# Patient Record
Sex: Female | Born: 1990 | Race: Black or African American | Hispanic: No | Marital: Single | Smoking: Never smoker
Health system: Southern US, Community
[De-identification: ages and names within clinical notes are randomized; demographics above are authoritative.]

---

## 2014-03-31 ENCOUNTER — Emergency Department (HOSPITAL_COMMUNITY): Payer: BC Managed Care – PPO

## 2014-03-31 ENCOUNTER — Emergency Department (HOSPITAL_COMMUNITY)
Admission: EM | Admit: 2014-03-31 | Discharge: 2014-03-31 | Disposition: A | Discharge status: Home / Self Care | Payer: BC Managed Care – PPO | Attending: Emergency Medicine | Admitting: Emergency Medicine

## 2014-03-31 ENCOUNTER — Encounter (HOSPITAL_COMMUNITY): Payer: Self-pay | Admitting: Emergency Medicine

## 2014-03-31 DIAGNOSIS — R109 Unspecified abdominal pain: Secondary | ICD-10-CM | POA: Insufficient documentation

## 2014-03-31 DIAGNOSIS — N7013 Chronic salpingitis and oophoritis: Secondary | ICD-10-CM | POA: Insufficient documentation

## 2014-03-31 DIAGNOSIS — B9689 Other specified bacterial agents as the cause of diseases classified elsewhere: Secondary | ICD-10-CM

## 2014-03-31 DIAGNOSIS — N760 Acute vaginitis: Secondary | ICD-10-CM | POA: Insufficient documentation

## 2014-03-31 DIAGNOSIS — N7011 Chronic salpingitis: Secondary | ICD-10-CM

## 2014-03-31 DIAGNOSIS — N83209 Unspecified ovarian cyst, unspecified side: Secondary | ICD-10-CM | POA: Insufficient documentation

## 2014-03-31 DIAGNOSIS — N83202 Unspecified ovarian cyst, left side: Secondary | ICD-10-CM

## 2014-03-31 LAB — URINALYSIS, ROUTINE W REFLEX MICROSCOPIC
BILIRUBIN URINE: NEGATIVE
Glucose, UA: NEGATIVE mg/dL
HGB URINE DIPSTICK: NEGATIVE
Ketones, ur: NEGATIVE mg/dL
Leukocytes, UA: NEGATIVE
NITRITE: NEGATIVE
PH: 6 (ref 5.0–8.0)
Protein, ur: NEGATIVE mg/dL
Urobilinogen, UA: 0.2 mg/dL (ref 0.0–1.0)

## 2014-03-31 LAB — COMPREHENSIVE METABOLIC PANEL
ALT: 14 U/L (ref 0–35)
AST: 14 U/L (ref 0–37)
Albumin: 3.5 g/dL (ref 3.5–5.2)
Alkaline Phosphatase: 75 U/L (ref 39–117)
BUN: 12 mg/dL (ref 6–23)
CALCIUM: 9 mg/dL (ref 8.4–10.5)
CO2: 26 mEq/L (ref 19–32)
Chloride: 102 mEq/L (ref 96–112)
Creatinine, Ser: 0.73 mg/dL (ref 0.50–1.10)
GFR calc non Af Amer: >90 mL/min (ref >90)
Glucose, Bld: 86 mg/dL (ref 70–99)
Potassium: 4.2 mEq/L (ref 3.7–5.3)
SODIUM: 140 meq/L (ref 137–147)
TOTAL PROTEIN: 7.9 g/dL (ref 6.0–8.3)
Total Bilirubin: <0.2 mg/dL — ABNORMAL LOW (ref 0.3–1.2)

## 2014-03-31 LAB — CBC WITH DIFFERENTIAL/PLATELET
BASOS ABS: 0 10*3/uL (ref 0.0–0.1)
Basophils Relative: 1 % (ref 0–1)
EOS ABS: 0.2 10*3/uL (ref 0.0–0.7)
EOS PCT: 3 % (ref 0–5)
HCT: 33.8 % — ABNORMAL LOW (ref 36.0–46.0)
Hemoglobin: 9.5 g/dL — ABNORMAL LOW (ref 12.0–15.0)
Lymphocytes Relative: 37 % (ref 12–46)
Lymphs Abs: 2.2 10*3/uL (ref 0.7–4.0)
MCH: 19.8 pg — AB (ref 26.0–34.0)
MCHC: 28.1 g/dL — ABNORMAL LOW (ref 30.0–36.0)
MCV: 70.6 fL — ABNORMAL LOW (ref 78.0–100.0)
Monocytes Absolute: 0.4 10*3/uL (ref 0.1–1.0)
Monocytes Relative: 7 % (ref 3–12)
Neutro Abs: 3.2 10*3/uL (ref 1.7–7.7)
Neutrophils Relative %: 53 % (ref 43–77)
PLATELETS: 207 10*3/uL (ref 150–400)
RBC: 4.79 MIL/uL (ref 3.87–5.11)
RDW: 15 % (ref 11.5–15.5)
WBC: 6.1 10*3/uL (ref 4.0–10.5)

## 2014-03-31 LAB — LIPASE, BLOOD: Lipase: 23 U/L (ref 11–59)

## 2014-03-31 LAB — WET PREP, GENITAL
Trich, Wet Prep: NONE SEEN
YEAST WET PREP: NONE SEEN

## 2014-03-31 LAB — PREGNANCY, URINE: Preg Test, Ur: NEGATIVE

## 2014-03-31 MED ORDER — HYDROCODONE-ACETAMINOPHEN 5-325 MG PO TABS: ORAL_TABLET | ORAL | Status: AC

## 2014-03-31 MED ORDER — METRONIDAZOLE 500 MG PO TABS
500.0000 mg | ORAL_TABLET | Freq: Once | ORAL | Status: AC
  Administered 2014-03-31: 500 mg via ORAL
  Filled 2014-03-31: qty 1

## 2014-03-31 MED ORDER — DOXYCYCLINE HYCLATE 100 MG PO TABS
100.0000 mg | ORAL_TABLET | Freq: Once | ORAL | Status: AC
  Administered 2014-03-31: 100 mg via ORAL
  Filled 2014-03-31: qty 1

## 2014-03-31 MED ORDER — METRONIDAZOLE 500 MG PO TABS: 500.0000 mg | ORAL_TABLET | Freq: Two times a day (BID) | ORAL | Status: AC

## 2014-03-31 MED ORDER — CEFTRIAXONE SODIUM 250 MG IJ SOLR
250.0000 mg | Freq: Once | INTRAMUSCULAR | Status: AC
  Administered 2014-03-31: 250 mg via INTRAMUSCULAR
  Filled 2014-03-31: qty 250

## 2014-03-31 MED ORDER — LIDOCAINE HCL (PF) 2 % IJ SOLN
INTRAMUSCULAR | Status: AC
  Administered 2014-03-31: 1 mL
  Filled 2014-03-31: qty 10

## 2014-03-31 MED ORDER — NAPROXEN 250 MG PO TABS: 250.0000 mg | ORAL_TABLET | Freq: Two times a day (BID) | ORAL | Status: AC

## 2014-03-31 MED ORDER — DOXYCYCLINE HYCLATE 100 MG PO TABS: 100.0000 mg | ORAL_TABLET | Freq: Two times a day (BID) | ORAL | Status: AC

## 2014-03-31 NOTE — ED Notes (Signed)
LLQ Abdominal pain began at ~0630. Denies any other symptoms at this time.

## 2014-03-31 NOTE — ED Notes (Signed)
Pt remains in xray.

## 2014-03-31 NOTE — ED Notes (Signed)
Pelvic exam by EDP. specimen to lab

## 2014-03-31 NOTE — Discharge Instructions (Signed)
°Emergency Department Resource Guide °1) Find a Doctor and Pay Out of Pocket °Although you won't have to find out who is covered by your insurance plan, it is a good idea to ask around and get recommendations. You will then need to call the office and see if the doctor you have chosen will accept you as a new patient and what types of options they offer for patients who are self-pay. Some doctors offer discounts or will set up payment plans for their patients who do not have insurance, but you will need to ask so you aren't surprised when you get to your appointment. ° °2) Contact Your Local Health Department °Not all health departments have doctors that can see patients for sick visits, but many do, so it is worth a call to see if yours does. If you don't know where your local health department is, you can check in your phone book. The CDC also has a tool to help you locate your state's health department, and many state websites also have listings of all of their local health departments. ° °3) Find a Walk-in Clinic °If your illness is not likely to be very severe or complicated, you may want to try a walk in clinic. These are popping up all over the country in pharmacies, drugstores, and shopping centers. They're usually staffed by nurse practitioners or physician assistants that have been trained to treat common illnesses and complaints. They're usually fairly quick and inexpensive. However, if you have serious medical issues or chronic medical problems, these are probably not your best option. ° °No Primary Care Doctor: °- Call [facility] at  832-8000 - they can help you locate a primary care doctor that  accepts your insurance, provides certain services, etc. °- Physician Referral Service- 1-800-533-3463 ° °Chronic Pain Problems: °Organization         Address  Phone   Notes  °[facility]  (336) 297-2271 Patients need to be referred by their primary care doctor.  ° °Medication  Assistance: °Organization         Address  Phone   Notes  °[place] [facility] [address] °[place], [place] [address] (336) 641-8030 --Must be a resident of [place] °-- Must have NO insurance coverage whatsoever (no Medicaid/ Medicare, etc.) °-- The pt. MUST have a primary care doctor that directs their care regularly and follows them in the community °  °[facility]  (866) 331-1348   °[employer]  (888) 892-1162   ° °Agencies that provide inexpensive medical care: °Organization         Address  Phone   Notes  °[facility]  (336) 832-8035   °[facility]    (336) 832-7272   °[facility] [address] °[place], [place] [address] (336) 832-4777   °[facility] [address], °[place] (336) 271-4999   °[facility]    (336) 373-0678   °[facility]    (336) 272-1050   °[facility] ° [address], [place] Phone:  (336) 832-4444, Fax:  (336) 832-4440 Hours of Operation:  9 am - 6 pm, M-F.  Also accepts Medicaid/Medicare and self-pay.  °[facility] ° [address], [place] Phone: (336) [phone], Fax: (336) [phone]. Hours of Operation:  8:30 am - 5:30 pm, M-F.  Also accepts Medicaid and self-pay.  °[facility] [place] [address], [place] Phone: (336) 878-6027   °[facility] [address], [place], [place] (336)723-1848, Ext. 123 Mondays & Thursdays: 7-9 AM.  First 15 patients are seen on a first come, first serve basis. °  ° °Medicaid-accepting [place] Providers: ° °Organization         Address  Phone   Notes  °[facility] [address], [place] (336) 641-2100 Also accepts self-pay patients.  °[facility] [address], [place] ° (336) 856-9996   °[facility] [address], [place]  (336) 288-8857   °[facility] [address] [place] [address], [place] (336) 299-7000   °Veita Bland [address], [place]  ° (336) 373-1557 Only accepts [place] Access Medicaid patients after they have their name applied to their card.  ° °Self-Pay (no insurance) in [place]: ° °Organization         Address  Phone   Notes  °Sickle Cell Patients, [facility] [address], [place] (336) 832-1970   °[facility] [address], [place] (336) 832-4400   °[facility] ° [address], [place] (336) 992-4800   °[facility]/Dr. Osei-Bonsu ° [address] [place] [address], [place] or [address], [place] (336) [phone] Phone number for both [place] and [place] locations is the same.  °[facility] [address], [place] (336) 299-0000   °[facility] [address] [place] [address], [place] or [address] (336) 852-7530 °(336) 878-2260   °[facility] [address], [place] (336) 350-1642, phone; (336) 294-5005, fax Sees patients 1st and 3rd Saturday of every month.  Must not qualify for public or private insurance (i.e. Medicaid, Medicare, [place] Health Choice, Veterans' Benefits) • Household income should be no more than 200% of the poverty level •The clinic cannot treat you if you are pregnant or think you are pregnant • Sexually transmitted diseases are not treated at the clinic.  ° ° °Dental Care: °Organization         Address  Phone  Notes  °[place] [facility] [address], [place] (336) 641-6152 Accepts children up to age 21 who are enrolled in Medicaid or [place] Health Choice; pregnant women with a Medicaid card; and children who have applied for Medicaid or [place] Health Choice, but were declined, whose parents can pay a reduced fee at time of service.  °[place]  [facility] [place]  [address], [place] (336) 641-7733 Accepts children up to age 21 who are enrolled in Medicaid or [place] Health Choice; pregnant women with a Medicaid card; and children who have applied for Medicaid or [place] Health Choice, but were declined, whose parents can pay a reduced fee at time of service.  °[facility] ° [address], [place] (336) 641-4533 Patients are seen by appointment only. Walk-ins are not accepted. [facility] will see patients 18 years of age and older. °Monday - Tuesday (8am-5pm) °Most Wednesdays (8:30-5pm) °$30 per visit, cash only  °[facility] ° [address], [place] (336) 641-4533 Patients are seen by appointment only. Walk-ins are not accepted. [facility] will see patients 18 years of age and older. °One   Wednesday Evening (Monthly: Volunteer Based).  $30 per visit, cash only  °[facility]  (919) 537-3737 for adults; Children under age 4, call Graduate Pediatric Dentistry at ([phone]) [phone]. Children aged 4-14, please call ([phone]) [phone] to request a pediatric application. ° Dental services are provided in all areas of dental care including fillings, crowns and bridges, complete and partial dentures, implants, gum treatment, root canals, and extractions. Preventive care is also provided. Treatment is provided to both adults and children. °Patients are selected via a lottery and there is often a waiting list. °  °[facility] [address], °[place] ° (336) 763-8833 [email or web address] °  °[facility] [address], [place], [place] (336)723-1848, Ext. 123 Second and Fourth Thursday of each month, opens at 6:30 AM; Clinic ends at 9 AM.  Patients are seen on a first-come first-served basis, and a limited number are seen during each clinic.  ° °[facility] ° [address], [place], [place] (336) 723-7904    Eligibility Requirements °You must have lived in [place], [place], or [place] counties for at least the last three months. °  You cannot be eligible for state or federal sponsored healthcare insurance, including Veterans Administration, Medicaid, or Medicare. °  You generally cannot be eligible for healthcare insurance through your employer.  °  How to apply: °Eligibility screenings are held every Tuesday and Wednesday afternoon from 1:00 pm until 4:00 pm. You do not need an appointment for the interview!  °[facility] [address], [place], [place] 336-631-2330   °[facility]  336-342-8273   °[facility]  336-703-3100   °[facility]  336-570-6415   ° °Behavioral Health Resources in the Community: °Intensive Outpatient Programs °Organization         Address  Phone  Notes  °[facility] [address], [place], [place] 336-878-6098   °[facility] [address], [place], [place] 336-832-9800   °[facility] [address], [place], [place] ° 336-882-2125   °[facility] [address],  °[place], [place] 1-800-853-5163 or 336-641-4981   °Substance Abuse Resources °Organization         Address  Phone  Notes  °[facility]  336-882-2125   °[facility]  336-784-9470   °[facility]  336-285-9073   °[facility]  336-845-3988   °Residential & Outpatient Substance Abuse Program  1-800-659-3381   °Psychological Services °Organization         Address  Phone  Notes  °[facility]  336- 832-9600   °[facility]  336- 378-7881   °[facility] [address], [place] 1-800-853-5163 or 336-641-4981   ° °Mobile Crisis Teams °Organization         Address  Phone  Notes  °[facility], Mobile Crisis Care Unit  1-877-626-1772   °Assertive °[facility] ° [address]  [place], [place] 336-834-9664   °Sharon DeEsch [address] °[place] 336-554-5454   ° °Self-Help/Support Groups °Organization         Address  Phone             Notes  °[facility]  - variety of support groups  336- 373-1402 Call for more information  °Narcotics Anonymous (NA), [facility] [address], °[place]  2 meetings at this location  ° °  Residential Treatment Programs Organization         Address  Phone  Notes  [facility] [address],    [place]  [phone]   [facility]  [address], [place] [phone], [place], [place] [phone]   [facility] [address], [place] [phone] Admissions: 8am-3pm M-F  [facility] [address]    [place], [place] [phone]   [facility] [address], [place], [place] [phone]   [facility] [address]  [place], [place] [phone]) [phone]   [facility] Intensive Outpatient [address], [place], [place] [phone]) [phone]   [facility] ([facility] [address]  [place], [place] [phone] or [phone]   [facility] [address] [place], [place] [phone]) [phone] Accepts Medicaid  [facility] [address]  [place] [phone] Substance Abuse/Addiction Treatment   [facility]         Address  Phone  Notes  [facility]  [phone](888) ([phone]   Angie FavaJulie Brannon, PhD [address], [address] [place], [place]   [phone] or [phone](336) [phone]   [facility]   [address] [place], [place] [phone](336) [phone]   [facility] [address], [place], [place] [phone](336) [phone] Insurance/Medicaid/sponsorship through [facility]  [facility] [address]                                    [place], [place] [phone](336) [phone] Therapy/tele-psych/case    [facility] [address]   [place], [place] [phone]) [phone]    Dr. Lolly MustacheArfeen  [phone]) [phone]   [facility]  [employer] [facility] 1) [address], [place] 2) [address], [place] 3)  [address], [place] [phone](336) [phone](336) [phone]  ([phone](336) [phone]   [facility] Child [facility] (819)572-2373(336) [phone] or [phone](336) [phone] (After Hours)       Take the prescriptions as directed.  Your gonorrhea and chlamydia culture is pending results, and you will receive a phone call in the next several days if it is positive.  However, you were treated empirically today with antibiotics for both gonorrhea and chlamydia.  Call your regular OB/GYN doctor tomorrow to schedule a follow up appointment within the next 3 days.  Return to the Emergency Department immediately if worsening.

## 2014-03-31 NOTE — ED Provider Notes (Signed)
CSN: [id number]     Arrival date & time 03/31/14  1234 History   First MD Initiated Contact with Patient 03/31/14 1518     Chief Complaint  Patient presents with  . Abdominal Pain  . Pelvic Pain     HPI Pt was seen at 1530.  Per pt, c/o gradual onset and persistence of constant left sided abd "pain" that began this morning when she woke up at 0600. Describes the abd pain as "hard cramping," and "it feels like I have to have a BM." Denies N/V/D, no fevers, no back pain, no rash, no CP/SOB, no black or blood in stools, no dysuria/hematuria, no vaginal bleeding/discharge.      History reviewed. No pertinent past medical history.  History reviewed. No pertinent past surgical history.   History  Substance Use Topics  . Smoking status: Never Smoker   . Smokeless tobacco: Not on file  . Alcohol Use: No    Review of Systems ROS: Statement: All systems negative except as marked or noted in the HPI; Constitutional: Negative for fever and chills. ; ; Eyes: Negative for eye pain, redness and discharge. ; ; ENMT: Negative for ear pain, hoarseness, nasal congestion, sinus pressure and sore throat. ; ; Cardiovascular: Negative for chest pain, palpitations, diaphoresis, dyspnea and peripheral edema. ; ; Respiratory: Negative for cough, wheezing and stridor. ; ; Gastrointestinal: +abd pain. Negative for nausea, vomiting, diarrhea, blood in stool, hematemesis, jaundice and rectal bleeding. . ; ; Genitourinary: Negative for dysuria, flank pain and hematuria. ; ; GYN:  No vaginal bleeding, no vaginal discharge, no vulvar pain.;; Musculoskeletal: Negative for back pain and neck pain. Negative for swelling and trauma.; ; Skin: Negative for pruritus, rash, abrasions, blisters, bruising and skin lesion.; ; Neuro: Negative for headache, lightheadedness and neck stiffness. Negative for weakness, altered level of consciousness , altered mental status, extremity weakness, paresthesias, involuntary movement, seizure  and syncope.        Allergies  Review of patient's allergies indicates no known allergies.  Home Medications   Prior to Admission medications   Medication Sig Start Date End Date Taking? Authorizing Provider  acetaminophen (TYLENOL) 500 MG tablet Take 1,000 mg by mouth daily as needed for moderate pain.   Yes Historical Provider, MD   BP 135/57  Pulse 75  Temp(Src) 98.1 F (36.7 C) (Oral)  Resp 16  Ht 5\' 3"  (1.6 m)  Wt 190 lb (86.183 kg)  BMI 33.67 kg/m2  SpO2 100%  LMP 03/17/2014 Physical Exam 1535: Physical examination:  Nursing notes reviewed; Vital signs and O2 SAT reviewed;  Constitutional: Well developed, Well nourished, Well hydrated, In no acute distress; Head:  Normocephalic, atraumatic; Eyes: EOMI, PERRL, No scleral icterus; ENMT: Mouth and pharynx normal, Mucous membranes moist; Neck: Supple, Full range of motion, No lymphadenopathy; Cardiovascular: Regular rate and rhythm, No murmur, rub, or gallop; Respiratory: Breath sounds clear & equal bilaterally, No rales, rhonchi, wheezes.  Speaking full sentences with ease, Normal respiratory effort/excursion; Chest: Nontender, Movement normal; Abdomen: Soft, +LLQ>LUQ tenderness to palp. No rebound or guarding. Nondistended, Normal bowel sounds; Genitourinary: No CVA tenderness. Pelvic exam performed with permission of pt and female ED tech assist during exam.  External genitalia w/o lesions. Vaginal vault with copious thin white discharge.  Cervix w/o lesions, not friable, GC/chlam and wet prep obtained and sent to lab.  Bimanual exam w/o CMT, uterine or right adnexal tenderness, +left pelvic tenderness..;; Extremities: Pulses normal, No tenderness, No edema, No calf edema or asymmetry.; Neuro:  AA&Ox3, Major CN grossly intact.  Speech clear. No gross focal motor or sensory deficits in extremities.; Skin: Color normal, Warm, Dry.   ED Course  Procedures     MDM  MDM Reviewed: previous chart, nursing note and vitals Reviewed  previous: labs Interpretation: labs, x-ray and ultrasound    Results for orders placed during the hospital encounter of 03/31/14  WET PREP, GENITAL      Result Value Ref Range   Yeast Wet Prep HPF POC NONE SEEN  NONE SEEN   Trich, Wet Prep NONE SEEN  NONE SEEN   Clue Cells Wet Prep HPF POC FEW (*) NONE SEEN   WBC, Wet Prep HPF POC FEW (*) NONE SEEN  CBC WITH DIFFERENTIAL      Result Value Ref Range   WBC 6.1  4.0 - 10.5 K/uL   RBC 4.79  3.87 - 5.11 MIL/uL   Hemoglobin 9.5 (*) 12.0 - 15.0 g/dL   HCT 47.833.8 (*) 29.536.0 - 62.146.0 %   MCV 70.6 (*) 78.0 - 100.0 fL   MCH 19.8 (*) 26.0 - 34.0 pg   MCHC 28.1 (*) 30.0 - 36.0 g/dL   RDW 30.815.0  65.711.5 - 84.615.5 %   Platelets 207  150 - 400 K/uL   Neutrophils Relative % 53  43 - 77 %   Neutro Abs 3.2  1.7 - 7.7 K/uL   Lymphocytes Relative 37  12 - 46 %   Lymphs Abs 2.2  0.7 - 4.0 K/uL   Monocytes Relative 7  3 - 12 %   Monocytes Absolute 0.4  0.1 - 1.0 K/uL   Eosinophils Relative 3  0 - 5 %   Eosinophils Absolute 0.2  0.0 - 0.7 K/uL   Basophils Relative 1  0 - 1 %   Basophils Absolute 0.0  0.0 - 0.1 K/uL  COMPREHENSIVE METABOLIC PANEL      Result Value Ref Range   Sodium 140  137 - 147 mEq/L   Potassium 4.2  3.7 - 5.3 mEq/L   Chloride 102  96 - 112 mEq/L   CO2 26  19 - 32 mEq/L   Glucose, Bld 86  70 - 99 mg/dL   BUN 12  6 - 23 mg/dL   Creatinine, Ser 9.620.73  0.50 - 1.10 mg/dL   Calcium 9.0  8.4 - 95.210.5 mg/dL   Total Protein 7.9  6.0 - 8.3 g/dL   Albumin 3.5  3.5 - 5.2 g/dL   AST 14  0 - 37 U/L   ALT 14  0 - 35 U/L   Alkaline Phosphatase 75  39 - 117 U/L   Total Bilirubin <0.2 (*) 0.3 - 1.2 mg/dL   GFR calc non Af Amer >90  >90 mL/min   GFR calc Af Amer >90  >90 mL/min  URINALYSIS, ROUTINE W REFLEX MICROSCOPIC      Result Value Ref Range   Color, Urine YELLOW  YELLOW   APPearance CLEAR  CLEAR   Specific Gravity, Urine >1.030 (*) 1.005 - 1.030   pH 6.0  5.0 - 8.0   Glucose, UA NEGATIVE  NEGATIVE mg/dL   Hgb urine dipstick NEGATIVE   NEGATIVE   Bilirubin Urine NEGATIVE  NEGATIVE   Ketones, ur NEGATIVE  NEGATIVE mg/dL   Protein, ur NEGATIVE  NEGATIVE mg/dL   Urobilinogen, UA 0.2  0.0 - 1.0 mg/dL   Nitrite NEGATIVE  NEGATIVE   Leukocytes, UA NEGATIVE  NEGATIVE  PREGNANCY, URINE      Result  Value Ref Range   Preg Test, Ur NEGATIVE  NEGATIVE  LIPASE, BLOOD      Result Value Ref Range   Lipase 23  11 - 59 U/L   US Transvaginal Non-ob US Art/ven Flow Abd Pelv Doppler 03/31/2014   CLINICAL DATA:  LEFT pelvic pain question ovarian torsion  EXAM: TRANSABDOMINAL AND TRANSVAGINAL ULTRASOUND OF PELVIS  DOPPLER ULTRASOUND OF OVARIES  TECHNIQUE: Both transabdominal and transvaginal ultrasound examinations of the pelvis were performed. Transabdominal technique was performed for global imaging of the pelvis including uterus, ovaries, adnexal regions, and pelvic cul-de-sac.  It was necessary to proceed with endovaginal exam following the transabdominal exam to visualize the ovaries and adnexae. Color and duplex Doppler ultrasound was utilized to evaluate blood flow to the ovaries. Transabdominal imaging limited by incomplete bladder distention and poor acoustic window.  COMPARISON:  None  FINDINGS: Uterus  Measurements: 8.4 x 4.7 x 5.7 cm. Normal morphology without mass.  Endometrium  Thickness: 12 mm thick, normal. No endometrial fluid or focal abnormality.  Right ovary  Measurements: 4.6 x 2.0 x 3.4 cm. Normal morphology with a small hypoechoic nodule question small hemorrhagic follicle cyst 1.7 x 0.9 x 1.3 cm. Blood flow present within RIGHT ovary on color Doppler imaging.  Left ovary  Measurements: 5.6 x 5.5 x 4.5 cm. Minimally hypoechoic nodule within LEFT ovary 4.4 x 4.0 x 3.5 cm. This could represent a hemorrhagic cyst or endometrioma. No additional mass lesions. Blood flow present within the LEFT ovary but not within the 4.4 cm mass on color Doppler imaging.  Pulsed Doppler evaluation of both ovaries demonstrates normal low-resistance  arterial and venous waveforms.  Other findings  Probable mild RIGHT hydrosalpinx.  No free pelvic fluid.  IMPRESSION: Probable mild RIGHT hydrosalpinx.  Otherwise unremarkable RIGHT ovary and uterus.  Slightly hypoechoic mass within LEFT ovary 4.4 x 4.0 x 3.5 cm, question hemorrhagic cyst versus endometrioma, tumor considered less likely but not excluded.  No evidence of ovarian torsion.  Followup ultrasound recommended in 6-12 weeks at 1 week following onset of menses to reassess this lesion.   Electronically Signed   By: Ulyses Southward M.D.   On: 03/31/2014 16:53   Dg Abd Acute W/chest 03/31/2014   CLINICAL DATA:  Abdominal pain  EXAM: ACUTE ABDOMEN SERIES (ABDOMEN 2 VIEW & CHEST 1 VIEW)  COMPARISON:  None.  FINDINGS: PA chest: Lungs are clear. The heart size and pulmonary vascularity are normal. No adenopathy.  Supine and upright abdomen: The bowel gas pattern is unremarkable. No obstruction or free air. There is moderate stool in the colon. There are no abnormal calcifications.  IMPRESSION: Bowel gas pattern unremarkable. Moderate stool in colon. Lungs clear.   Electronically Signed   By: Bretta Bang M.D.   On: 03/31/2014 17:05    1810:  Will tx for BV and likely STD with abx. Tx ovarian cyst symptomatically at this time. Strongly encouraged to f/u with OB/GYN; verb understanding. Dx and testing d/w pt.  Questions answered.  Verb understanding, agreeable to d/c home with outpt f/u.   Laray Anger, DO 04/03/14 1718

## 2014-04-01 LAB — GC/CHLAMYDIA PROBE AMP
CT Probe RNA: POSITIVE — AB
GC Probe RNA: NEGATIVE

## 2014-04-03 ENCOUNTER — Telehealth (HOSPITAL_BASED_OUTPATIENT_CLINIC_OR_DEPARTMENT_OTHER): Payer: Self-pay | Admitting: Emergency Medicine

## 2014-04-03 NOTE — Telephone Encounter (Signed)
+  Chlamydia. Patient treated with Rocephin and Doxycycline. [employer] faxed.

## 2014-04-06 ENCOUNTER — Telehealth (HOSPITAL_BASED_OUTPATIENT_CLINIC_OR_DEPARTMENT_OTHER): Payer: Self-pay

## 2014-04-06 NOTE — Telephone Encounter (Signed)
Pt ID verified # 2. Pt informed of dx, tx rcvd approp., notify partner(s) for testing and tx and abstain from sex x 2 wks form tx date.

## 2014-04-15 ENCOUNTER — Encounter: Payer: Self-pay | Admitting: Obstetrics & Gynecology

## 2015-11-24 IMAGING — US US TRANSVAGINAL NON-OB
1 series · 13 of 25 positions shown · non-contrast
Comparison: None

CLINICAL DATA: LEFT pelvic pain question ovarian torsion

EXAM:
TRANSABDOMINAL AND TRANSVAGINAL ULTRASOUND OF PELVIS
DOPPLER ULTRASOUND OF OVARIES
TECHNIQUE: Both transabdominal and transvaginal ultrasound examinations of the
pelvis were performed. Transabdominal technique was performed for
global imaging of the pelvis including uterus, ovaries, adnexal
regions, and pelvic cul-de-sac.
It was necessary to proceed with endovaginal exam following the
transabdominal exam to visualize the ovaries and adnexae. Color and
duplex Doppler ultrasound was utilized to evaluate blood flow to the
ovaries. Transabdominal imaging limited by incomplete bladder
distention and poor acoustic window.

[Series 1: us transvaginal non-ob · 0.20mm/px · 13 of 137 slices shown]
[im 1/137]
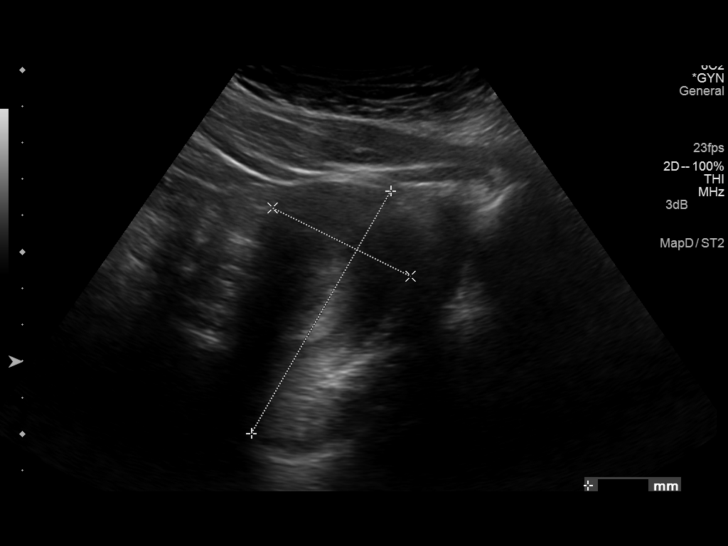
[im 12/137]
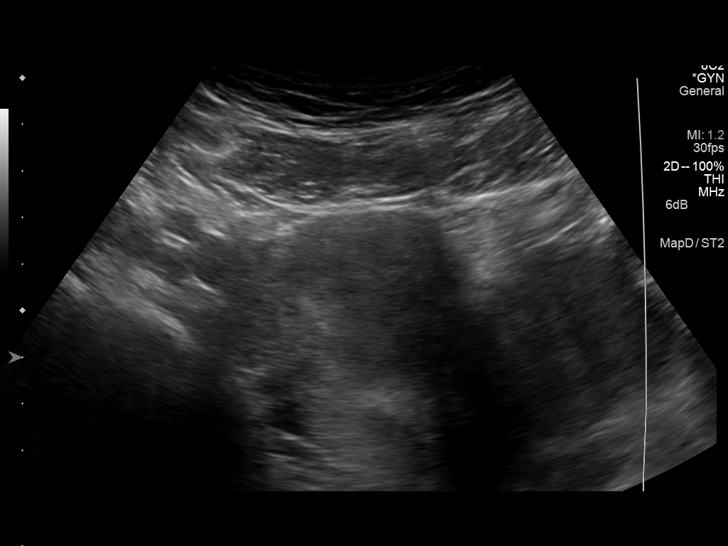
[im 23/137]
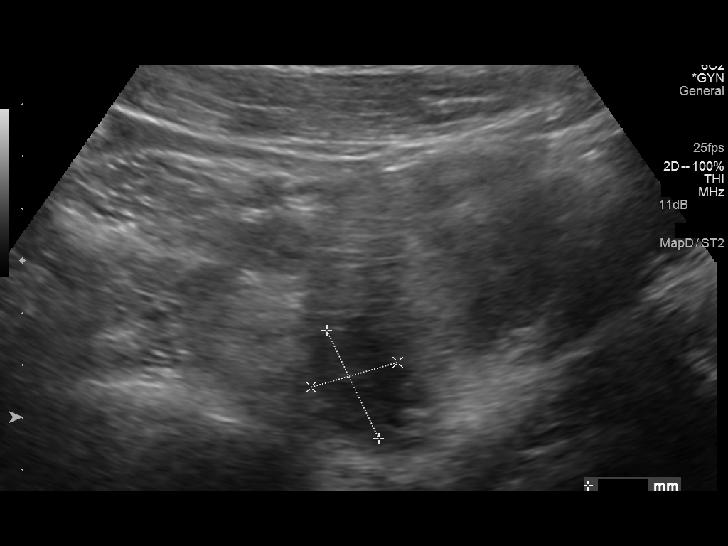
[im 35/137]
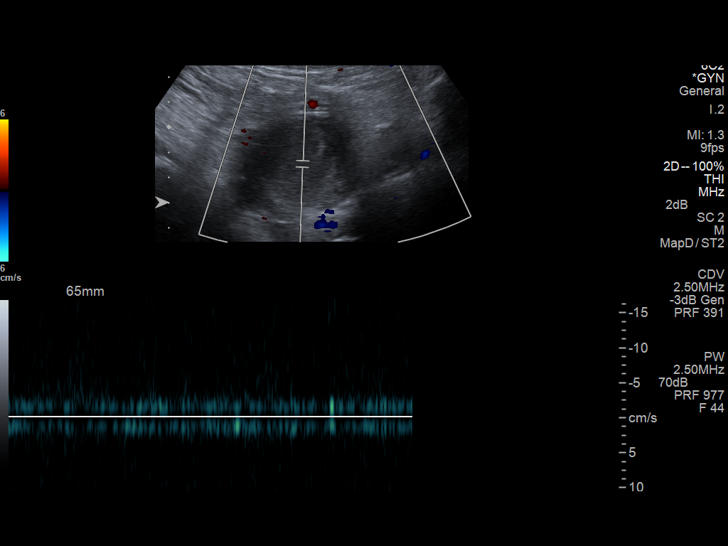
[im 46/137]
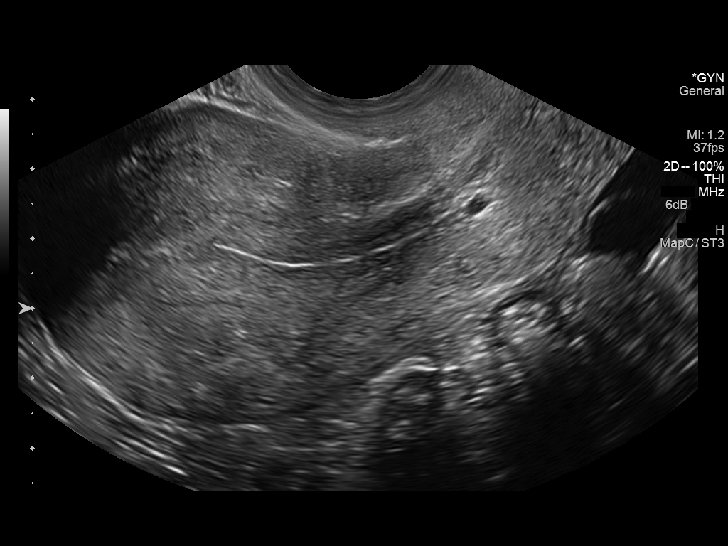
[im 57/137]
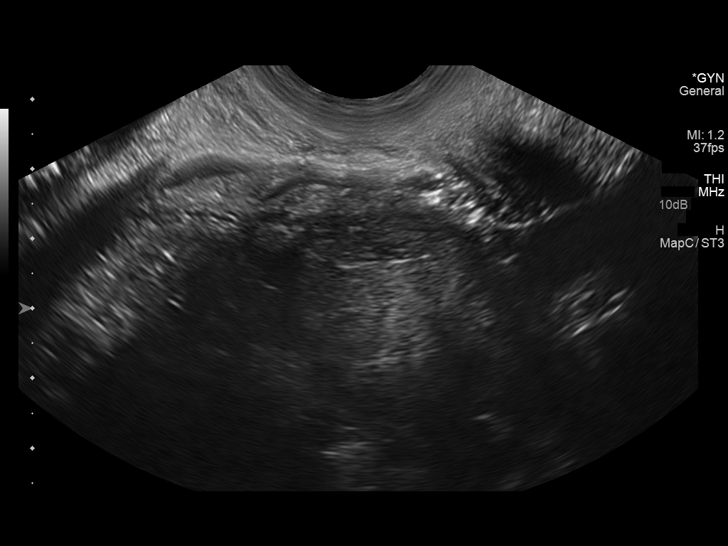
[im 69/137]
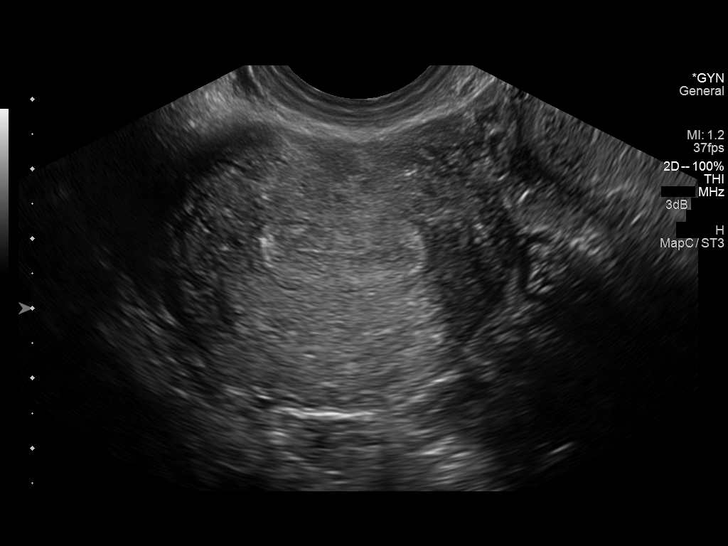
[im 80/137]
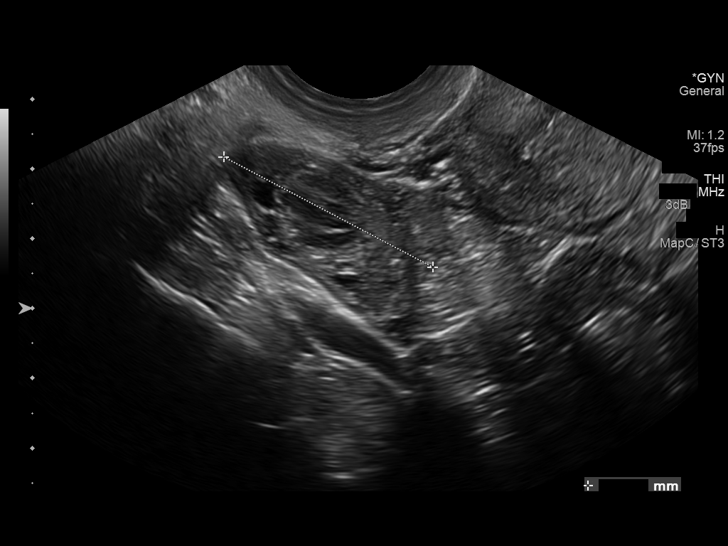
[im 91/137]
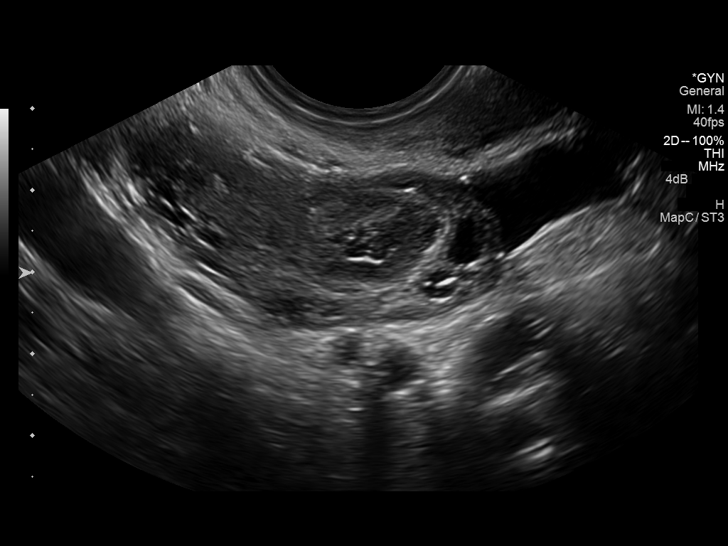
[im 103/137]
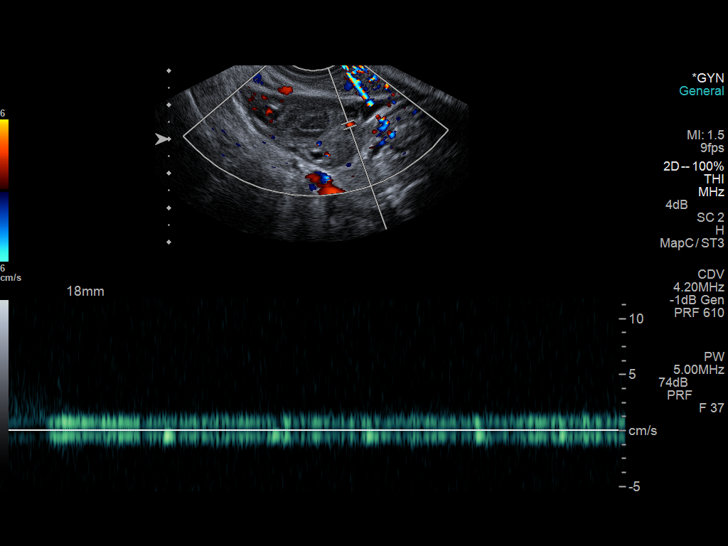
[im 114/137]
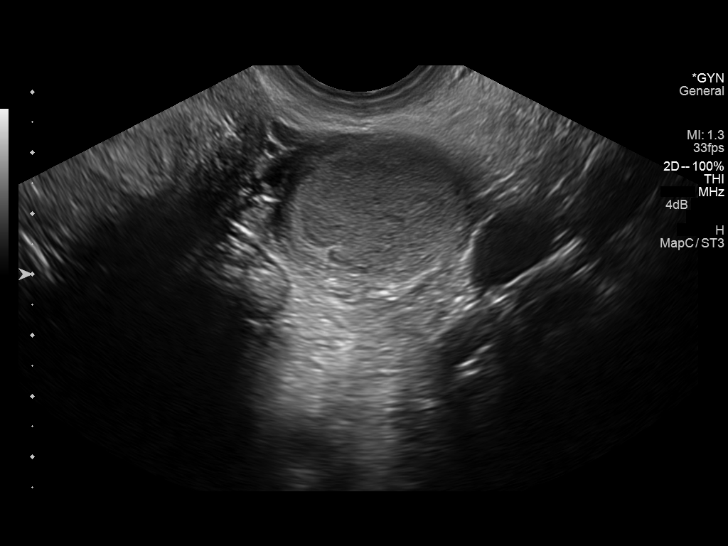
[im 125/137]
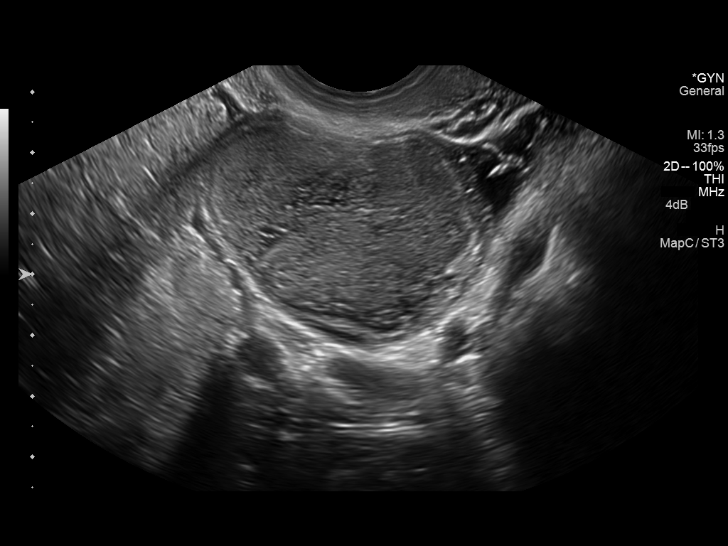
[im 137/137]
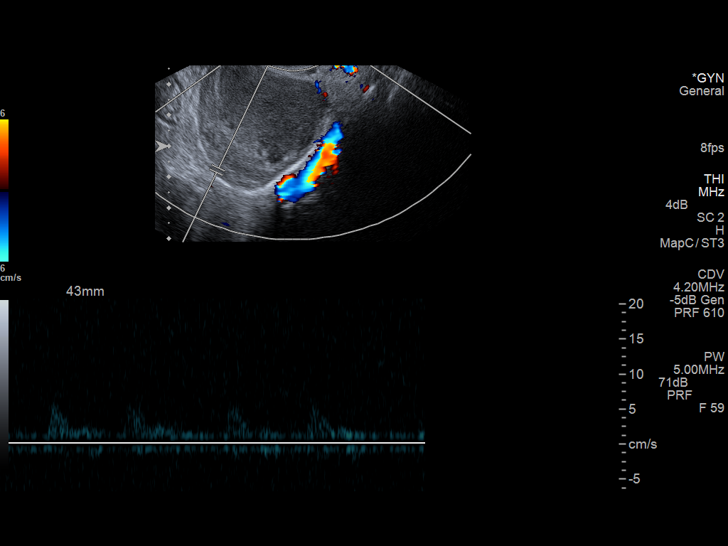

[13 of 25 positions shown; findings below may reference images not displayed]

FINDINGS: Uterus

Measurements: 8.4 x 4.7 x 5.7 cm. Normal morphology without mass.

Endometrium

Thickness: 12 mm thick, normal.. No endometrial fluid or focal
abnormality.

Right ovary

Measurements: 4.6 x 2.0 x 3.4 cm. Normal morphology with a small
hypoechoic nodule question small hemorrhagic follicle cyst 1.7 x
x 1.3 cm. Blood flow present within RIGHT ovary on color Doppler
imaging.

Left ovary

Measurements: 5.6 x 5.5 x 4.5 cm. Minimally hypoechoic nodule within
LEFT ovary 4.4 x 4.0 x 3.5 cm. This could represent a hemorrhagic
cyst or endometrioma. No additional mass lesions. Blood flow present
within the LEFT ovary but not within the 4.4 cm mass on color
Doppler imaging.

Pulsed Doppler evaluation of both ovaries demonstrates normal
low-resistance arterial and venous waveforms.

Other findings

Probable mild RIGHT hydrosalpinx.  No free pelvic fluid.
IMPRESSION: Probable mild RIGHT hydrosalpinx.

Otherwise unremarkable RIGHT ovary and uterus.

Slightly hypoechoic mass within LEFT ovary 4.4 x 4.0 x 3.5 cm,
question hemorrhagic cyst versus endometrioma, tumor considered less
likely but not excluded.

No evidence of ovarian torsion.

Followup ultrasound recommended in 6-12 weeks at 1 week following
onset of menses to reassess this lesion.

## 2015-11-24 IMAGING — CR DG ABDOMEN ACUTE W/ 1V CHEST
3 series · 3 of 3 positions shown · non-contrast
Comparison: None.

CLINICAL DATA: Abdominal pain

EXAM:
ACUTE ABDOMEN SERIES (ABDOMEN 2 VIEW & CHEST 1 VIEW)

[view not recorded (1 of 3)]
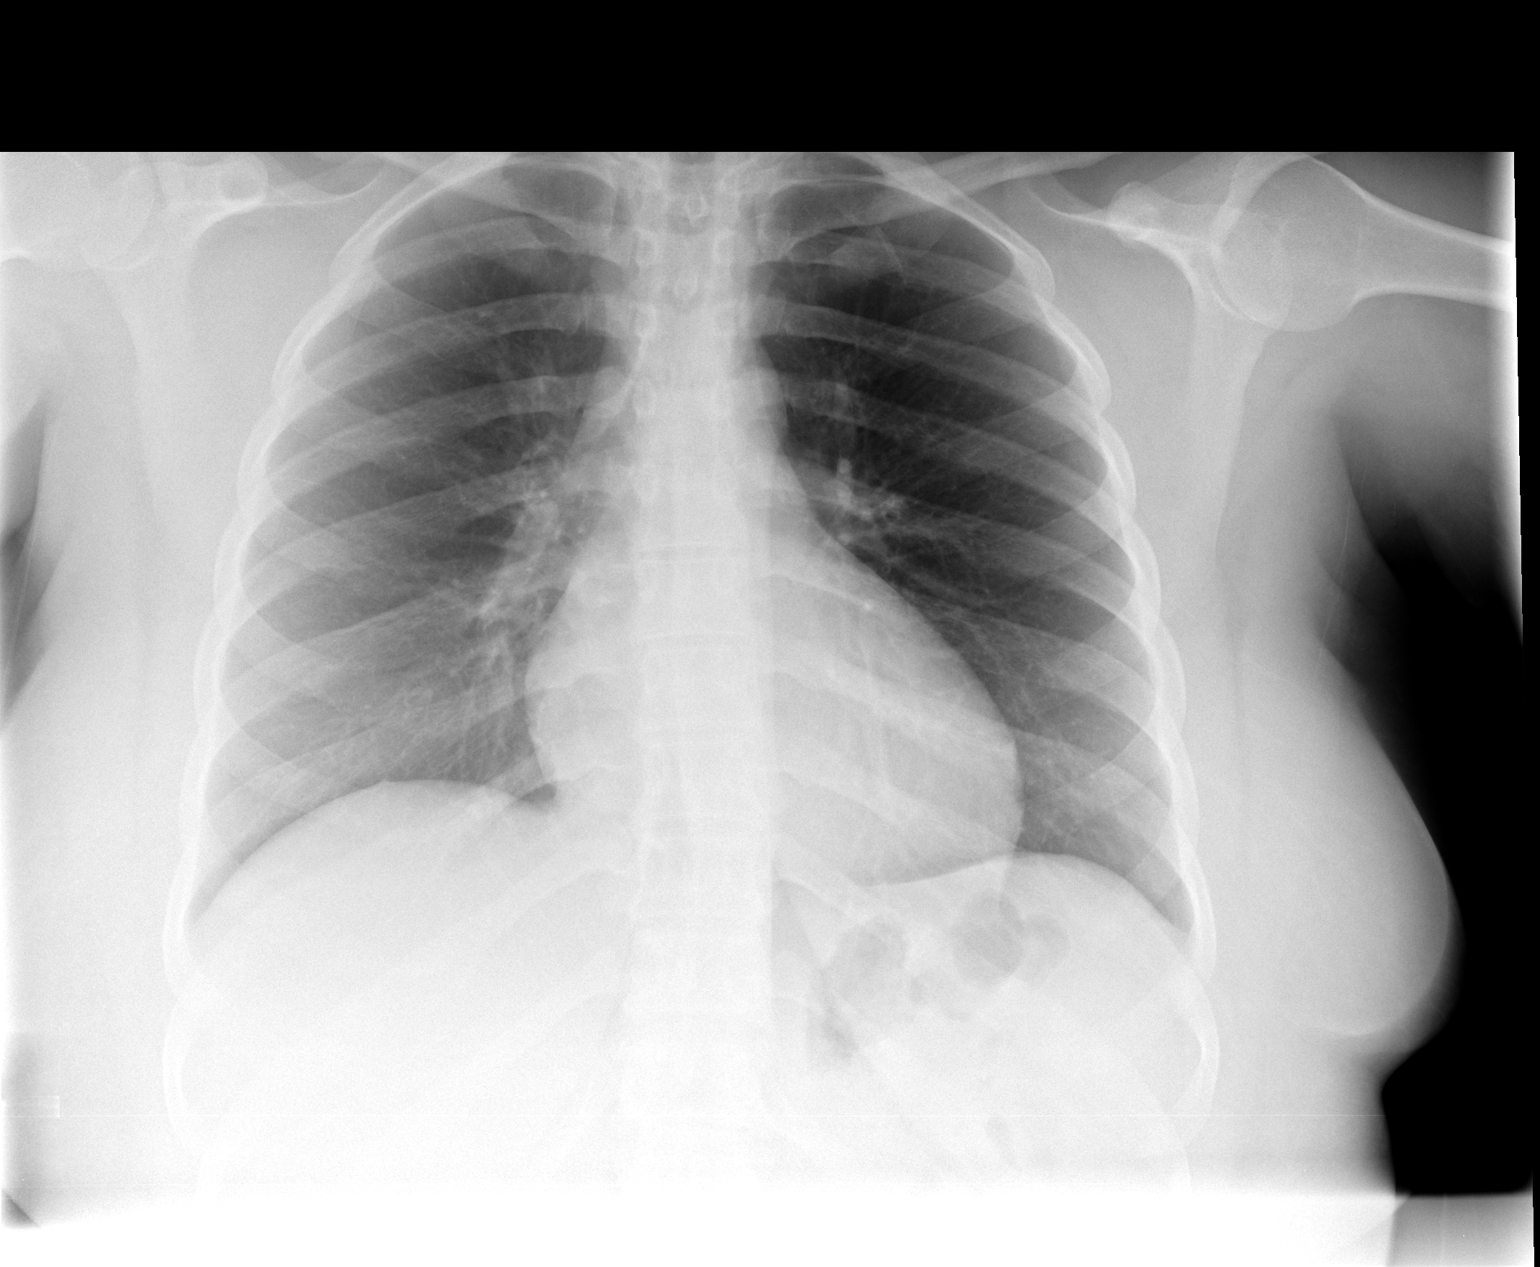

[view not recorded (2 of 3)]
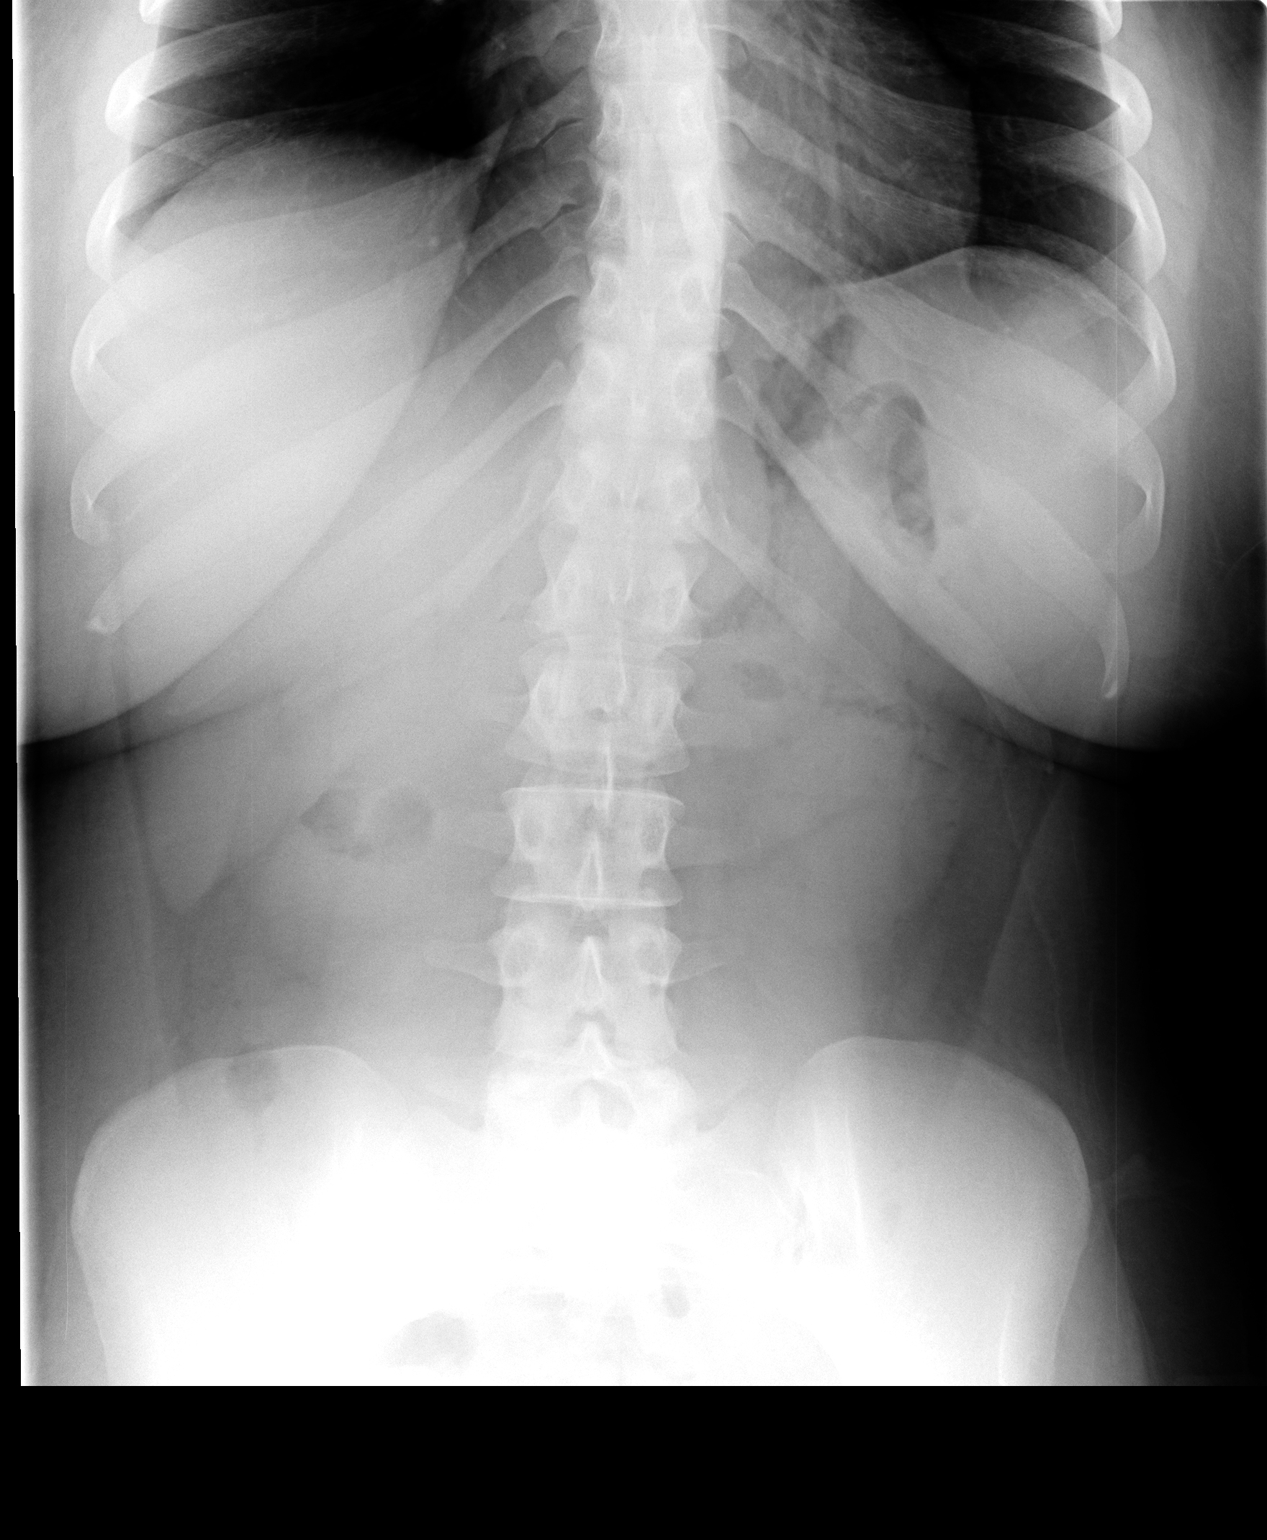

[view not recorded (3 of 3)]
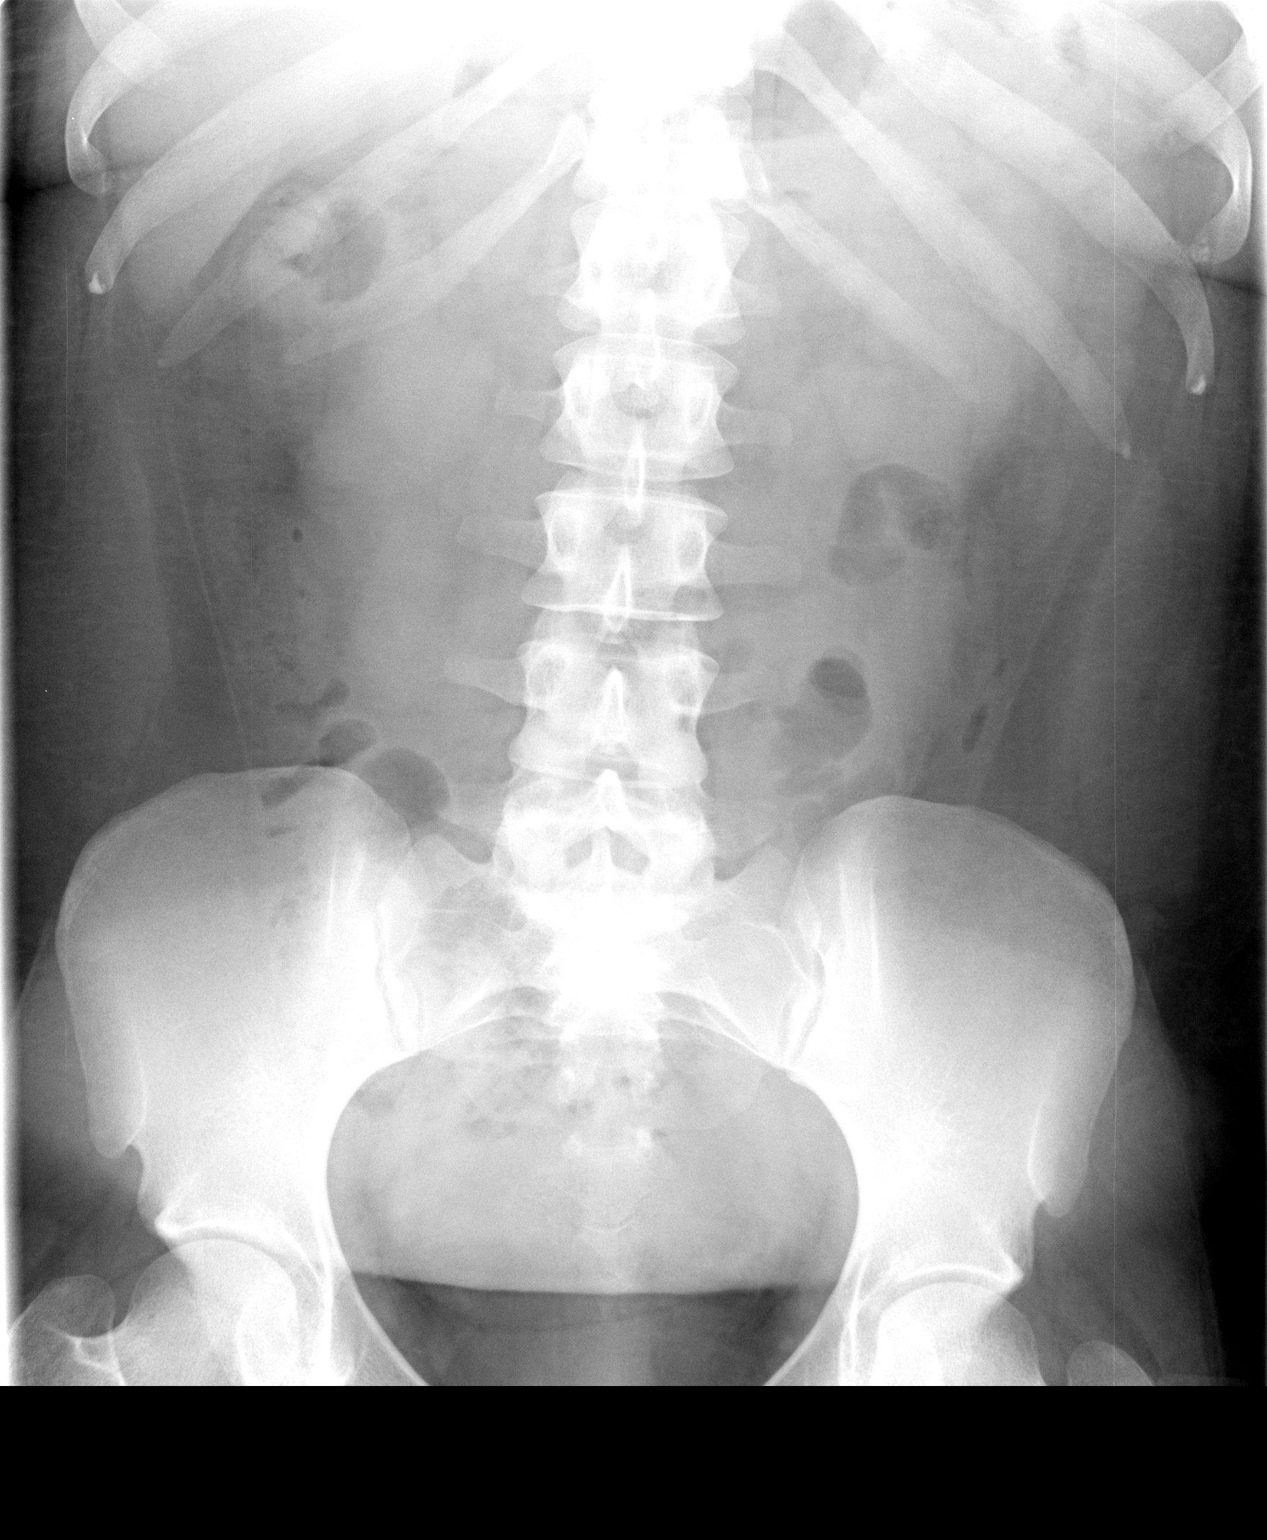

[3 of 3 positions shown; findings below may reference images not displayed]

FINDINGS: PA chest: Lungs are clear. The heart size and pulmonary vascularity
are normal. No adenopathy.

Supine and upright abdomen: The bowel gas pattern is unremarkable.
No obstruction or free air. There is moderate stool in the colon.
There are no abnormal calcifications.
IMPRESSION: Bowel gas pattern unremarkable. Moderate stool in colon. Lungs
clear.

## 2015-12-28 ENCOUNTER — Emergency Department (HOSPITAL_COMMUNITY)
Admission: EM | Admit: 2015-12-28 | Discharge: 2015-12-29 | Disposition: A | Discharge status: Home / Self Care | Payer: Self-pay | Attending: Emergency Medicine | Admitting: Emergency Medicine

## 2015-12-28 ENCOUNTER — Encounter (HOSPITAL_COMMUNITY): Payer: Self-pay | Admitting: Vascular Surgery

## 2015-12-28 DIAGNOSIS — N760 Acute vaginitis: Secondary | ICD-10-CM | POA: Insufficient documentation

## 2015-12-28 DIAGNOSIS — Z3202 Encounter for pregnancy test, result negative: Secondary | ICD-10-CM | POA: Insufficient documentation

## 2015-12-28 DIAGNOSIS — B9689 Other specified bacterial agents as the cause of diseases classified elsewhere: Secondary | ICD-10-CM

## 2015-12-28 DIAGNOSIS — R51 Headache: Secondary | ICD-10-CM | POA: Insufficient documentation

## 2015-12-28 LAB — URINALYSIS, ROUTINE W REFLEX MICROSCOPIC
Bilirubin Urine: NEGATIVE
Glucose, UA: NEGATIVE mg/dL
KETONES UR: 15 mg/dL — AB
Leukocytes, UA: NEGATIVE
NITRITE: NEGATIVE
Protein, ur: NEGATIVE mg/dL
Specific Gravity, Urine: 1.02 (ref 1.005–1.030)
pH: 6.5 (ref 5.0–8.0)

## 2015-12-28 LAB — COMPREHENSIVE METABOLIC PANEL
ALT: 15 U/L (ref 14–54)
AST: 22 U/L (ref 15–41)
Albumin: 3.3 g/dL — ABNORMAL LOW (ref 3.5–5.0)
Alkaline Phosphatase: 53 U/L (ref 38–126)
Anion gap: 13 (ref 5–15)
BILIRUBIN TOTAL: 0.3 mg/dL (ref 0.3–1.2)
BUN: 8 mg/dL (ref 6–20)
CHLORIDE: 105 mmol/L (ref 101–111)
CO2: 24 mmol/L (ref 22–32)
Calcium: 9.3 mg/dL (ref 8.9–10.3)
Creatinine, Ser: 0.7 mg/dL (ref 0.44–1.00)
GFR calc Af Amer: >60 mL/min (ref >60)
Glucose, Bld: 92 mg/dL (ref 65–99)
POTASSIUM: 3.9 mmol/L (ref 3.5–5.1)
Sodium: 142 mmol/L (ref 135–145)
TOTAL PROTEIN: 7.1 g/dL (ref 6.5–8.1)

## 2015-12-28 LAB — CBC
HEMATOCRIT: 38.9 % (ref 36.0–46.0)
Hemoglobin: 12.4 g/dL (ref 12.0–15.0)
MCH: 26.4 pg (ref 26.0–34.0)
MCHC: 31.9 g/dL (ref 30.0–36.0)
MCV: 82.8 fL (ref 78.0–100.0)
PLATELETS: 158 10*3/uL (ref 150–400)
RBC: 4.7 MIL/uL (ref 3.87–5.11)
RDW: 11.7 % (ref 11.5–15.5)
WBC: 4.9 10*3/uL (ref 4.0–10.5)

## 2015-12-28 LAB — WET PREP, GENITAL
SPERM: NONE SEEN
Trich, Wet Prep: NONE SEEN
Yeast Wet Prep HPF POC: NONE SEEN

## 2015-12-28 LAB — URINE MICROSCOPIC-ADD ON

## 2015-12-28 LAB — I-STAT BETA HCG BLOOD, ED (MC, WL, AP ONLY): I-stat hCG, quantitative: <5 m[IU]/mL (ref <5)

## 2015-12-28 MED ORDER — ONDANSETRON HCL 4 MG/2ML IJ SOLN
4.0000 mg | Freq: Once | INTRAMUSCULAR | Status: AC
  Administered 2015-12-28: 4 mg via INTRAVENOUS
  Filled 2015-12-28: qty 2

## 2015-12-28 MED ORDER — SODIUM CHLORIDE 0.9 % IV BOLUS (SEPSIS)
1000.0000 mL | Freq: Once | INTRAVENOUS | Status: AC
  Administered 2015-12-28: 1000 mL via INTRAVENOUS

## 2015-12-28 NOTE — ED Provider Notes (Signed)
CSN: [id number]     Arrival date & time 12/28/15  1656 History   First MD Initiated Contact with Patient 12/28/15 2148     Chief Complaint  Patient presents with  . Emesis  . Headache     (Consider location/radiation/quality/duration/timing/severity/associated sxs/prior Treatment) HPI   Patient to the ER with multiple complaints. She just got out of a new relationship and is concerned about STD. No vaginal discharge. Also concerned about possible pregnancy. Most of her symptoms resolved yesterday. She started to feel sick 4 days ago. She has had intermittent frontal pressure headaches that resolves on its own. She had last episode of vomit this morning. Says she has eaten and drank broccoli and steak since then. She has not had any belly pain or back pain. No fevers. She had sore throat which has since resolved.  PCP: No PCP Per Patient  Arlester MarkerSierra C Manz is a 25 y.o.  female  ROS: The patient denies diaphoresis, fever, weakness (general or focal), confusion, change of vision,  dysphagia, aphagia, shortness of breath,  abdominal pains, diarrhea, lower extremity swelling, rash, neck pain, chest pain   History reviewed. No pertinent past medical history. History reviewed. No pertinent past surgical history. No family history on file. Social History  Substance Use Topics  . Smoking status: Never Smoker   . Smokeless tobacco: Never Used  . Alcohol Use: No   OB History    No data available     Review of Systems  Review of Systems All other systems negative except as documented in the HPI. All pertinent positives and negatives as reviewed in the HPI.   Allergies  Hydrocodone  Home Medications   Prior to Admission medications   Medication Sig Start Date End Date Taking? Authorizing Provider  doxycycline (VIBRA-TABS) 100 MG tablet Take 1 tablet (100 mg total) by mouth 2 (two) times daily. Patient not taking: Reported on 12/28/2015 03/31/14   Samuel JesterKathleen McManus, DO   HYDROcodone-acetaminophen (NORCO/VICODIN) 5-325 MG per tablet 1 or 2 tabs PO q6 hours prn pain Patient not taking: Reported on 12/28/2015 03/31/14   Samuel JesterKathleen McManus, DO  metroNIDAZOLE (FLAGYL) 500 MG tablet Take 1 tablet (500 mg total) by mouth 2 (two) times daily. Patient not taking: Reported on 12/28/2015 03/31/14   Samuel JesterKathleen McManus, DO  metroNIDAZOLE (FLAGYL) 500 MG tablet Take 1 tablet (500 mg total) by mouth 2 (two) times daily. 12/29/15   Eniyah Eastmond Neva SeatGreene, PA-C  naproxen (NAPROSYN) 250 MG tablet Take 1 tablet (250 mg total) by mouth 2 (two) times daily with a meal. Patient not taking: Reported on 12/28/2015 03/31/14   Samuel JesterKathleen McManus, DO   BP 125/68 mmHg  Pulse 50  Temp(Src) 97.7 F (36.5 C) (Oral)  Resp 18  SpO2 100%  LMP 12/11/2015 Physical Exam  Constitutional: She appears well-developed and well-nourished. No distress.  HENT:  Head: Normocephalic and atraumatic.  Right Ear: Tympanic membrane and ear canal normal.  Left Ear: Tympanic membrane and ear canal normal.  Nose: Nose normal.  Mouth/Throat: Uvula is midline, oropharynx is clear and moist and mucous membranes are normal.  Eyes: Pupils are equal, round, and reactive to light.  Neck: Normal range of motion. Neck supple.  Cardiovascular: Normal rate and regular rhythm.   Pulmonary/Chest: Effort normal.  Abdominal: Soft. Bowel sounds are normal. There is no tenderness. There is no rigidity, no rebound, no guarding and no CVA tenderness.  No signs of abdominal distention  Genitourinary: Uterus normal. Cervix exhibits no motion tenderness and no discharge.  Right adnexum displays no mass and no tenderness. Left adnexum displays no mass and no tenderness.  Light pink spotting within vaginal vault.  Musculoskeletal:  No LE swelling  Neurological: She is alert.  Acting at baseline  Skin: Skin is warm and dry. No rash noted.  Nursing note and vitals reviewed.  ED Course  Procedures (including critical care time) Labs  Review Labs Reviewed  WET PREP, GENITAL - Abnormal; Notable for the following:    Clue Cells Wet Prep HPF POC PRESENT (*)    WBC, Wet Prep HPF POC MODERATE (*)    All other components within normal limits  COMPREHENSIVE METABOLIC PANEL - Abnormal; Notable for the following:    Albumin 3.3 (*)    All other components within normal limits  URINALYSIS, ROUTINE W REFLEX MICROSCOPIC (NOT AT [facility]) - Abnormal; Notable for the following:    APPearance HAZY (*)    Hgb urine dipstick TRACE (*)    Ketones, ur 15 (*)    All other components within normal limits  URINE MICROSCOPIC-ADD ON - Abnormal; Notable for the following:    Squamous Epithelial / LPF 6-30 (*)    Bacteria, UA FEW (*)    All other components within normal limits  CBC  I-STAT BETA HCG BLOOD, ED ([facility], [facility], [facility] ONLY)  GC/CHLAMYDIA PROBE AMP ([place]) NOT AT [facility]    Imaging Review No results found. I have personally reviewed and evaluated these images and lab results as part of my medical decision-making.   EKG Interpretation None      MDM   Final diagnoses:  Bacterial vaginosis   the patient is tolerating by mouth and is overall healthy.  Her CBC, CMP and urinalysis are reassuring. She is not pregnant. Her wet prep shows clue cells and moderate white blood cells.  Medications  cefTRIAXone (ROCEPHIN) injection 500 mg (not administered)  azithromycin (ZITHROMAX) tablet 1,000 mg (not administered)  metroNIDAZOLE (FLAGYL) tablet 500 mg (not administered)  ondansetron (ZOFRAN) injection 4 mg (4 mg Intravenous Given 12/28/15 2223)  sodium chloride 0.9 % bolus 1,000 mL (1,000 mLs Intravenous New Bag/Given 12/28/15 2222)    Patient is nontoxic, nonseptic appearing, in no apparent distress.  Patient's pain and other symptoms adequately managed in emergency department.  Fluid bolus given.  Labs, imaging and vitals reviewed.  Patient does not meet the SIRS or Sepsis criteria.  On repeat exam patient does not have a surgical  abdomin and there are no peritoneal signs.  No indication of appendicitis, bowel obstruction, bowel perforation, cholecystitis, diverticulitis, PID or ectopic pregnancy.  Patient discharged home with symptomatic treatment and given strict instructions for follow-up with their primary care physician.  I have also discussed reasons to return immediately to the ER.  Patient expresses understanding and agrees with plan.   Rx: Flagyl   Marlon Pel, PA-C 12/29/15 0037  Rolland Porter, MD 01/09/16 [phone]

## 2015-12-28 NOTE — ED Notes (Signed)
Pt reports to the ED for eval of N/V, intermittent HAs, and intermittent fevers x 4 days. She reports she could possibly be pregnant. Pt also reports diarrhea as well. She also reports vaginal bleeding as well but it is not time for her menstrual cycle. Pt A&Ox4, resp e/u, and skin warm and dry.

## 2015-12-29 LAB — GC/CHLAMYDIA PROBE AMP (~~LOC~~) NOT AT ARMC
Chlamydia: NEGATIVE
NEISSERIA GONORRHEA: NEGATIVE

## 2015-12-29 MED ORDER — METRONIDAZOLE 500 MG PO TABS
500.0000 mg | ORAL_TABLET | Freq: Once | ORAL | Status: AC
  Administered 2015-12-29: 500 mg via ORAL
  Filled 2015-12-29: qty 1

## 2015-12-29 MED ORDER — CEFTRIAXONE SODIUM 1 G IJ SOLR
500.0000 mg | Freq: Once | INTRAMUSCULAR | Status: AC
  Administered 2015-12-29: 500 mg via INTRAMUSCULAR
  Filled 2015-12-29: qty 10

## 2015-12-29 MED ORDER — METRONIDAZOLE 500 MG PO TABS: 500.0000 mg | ORAL_TABLET | Freq: Two times a day (BID) | ORAL | Status: AC

## 2015-12-29 MED ORDER — AZITHROMYCIN 250 MG PO TABS
1000.0000 mg | ORAL_TABLET | Freq: Once | ORAL | Status: AC
Start: 2015-12-29 — End: 2015-12-29
  Administered 2015-12-29: 1000 mg via ORAL
  Filled 2015-12-29: qty 4

## 2015-12-29 NOTE — Discharge Instructions (Signed)
Bacterial Vaginosis Bacterial vaginosis is a vaginal infection that occurs when the normal balance of bacteria in the vagina is disrupted. It results from an overgrowth of certain bacteria. This is the most common vaginal infection in women of childbearing age. Treatment is important to prevent complications, especially in pregnant women, as it can cause a premature delivery. CAUSES  Bacterial vaginosis is caused by an increase in harmful bacteria that are normally present in smaller amounts in the vagina. Several different kinds of bacteria can cause bacterial vaginosis. However, the reason that the condition develops is not fully understood. RISK FACTORS Certain activities or behaviors can put you at an increased risk of developing bacterial vaginosis, including:  Having a new sex partner or multiple sex partners.  Douching.  Using an intrauterine device (IUD) for contraception. Women do not get bacterial vaginosis from toilet seats, bedding, swimming pools, or contact with objects around them. SIGNS AND SYMPTOMS  Some women with bacterial vaginosis have no signs or symptoms. Common symptoms include:  Grey vaginal discharge.  A fishlike odor with discharge, especially after sexual intercourse.  Itching or burning of the vagina and vulva.  Burning or pain with urination. DIAGNOSIS  Your health care provider will take a medical history and examine the vagina for signs of bacterial vaginosis. A sample of vaginal fluid may be taken. Your health care provider will look at this sample under a microscope to check for bacteria and abnormal cells. A vaginal pH test may also be done.  TREATMENT  Bacterial vaginosis may be treated with antibiotic medicines. These may be given in the form of a pill or a vaginal cream. A second round of antibiotics may be prescribed if the condition comes back after treatment. Because bacterial vaginosis increases your risk for sexually transmitted diseases, getting  treated can help reduce your risk for chlamydia, gonorrhea, HIV, and herpes. HOME CARE INSTRUCTIONS   Only take over-the-counter or prescription medicines as directed by your health care provider.  If antibiotic medicine was prescribed, take it as directed. Make sure you finish it even if you start to feel better.  Tell all sexual partners that you have a vaginal infection. They should see their health care provider and be treated if they have problems, such as a mild rash or itching.  During treatment, it is important that you follow these instructions:  Avoid sexual activity or use condoms correctly.  Do not douche.  Avoid alcohol as directed by your health care provider.  Avoid breastfeeding as directed by your health care provider. SEEK MEDICAL CARE IF:   Your symptoms are not improving after 3 days of treatment.  You have increased discharge or pain.  You have a fever. MAKE SURE YOU:   Understand these instructions.  Will watch your condition.  Will get help right away if you are not doing well or get worse. FOR MORE INFORMATION  Centers for Disease Control and Prevention, Division of STD Prevention: SolutionApps.co.zawww.cdc.gov/std American Sexual Health Association (ASHA): www.ashastd.org    This information is not intended to replace advice given to you by your health care provider. Make sure you discuss any questions you have with your health care provider.   Document Released: 10/03/2005 Document Revised: 10/24/2014 Document Reviewed: 05/15/2013 Elsevier Interactive Patient Education 2016 [employer]vier Inc. Genital Herpes Genital herpes is a common sexually transmitted infection (STI) that is caused by a virus. The virus is spread from person to person through sexual contact. Infection can cause itching, blisters, and sores in the  genital area or rectal area. This is called an outbreak. It affects both men and women. Genital herpes is particularly concerning for pregnant women because  the virus can be passed to the baby during delivery and cause serious problems. Genital herpes is also a concern for people with a weakened defense (immune) system. Symptoms of genital herpes may last several days and then go away. However, the virus remains in your body, so you may have more outbreaks of symptoms in the future. The time between outbreaks varies and can be months or years. CAUSES Genital herpes is caused by a virus called herpes simplex virus (HSV) type 2 or HSV type 1. These viruses are contagious and are most often spread through sexual contact with an infected person. Sexual contact includes vaginal, anal, and oral sex. RISK FACTORS Risk factors for genital herpes include:  Being sexually active with multiple partners.  Having unprotected sex. SIGNS AND SYMPTOMS Symptoms may include:  Pain and itching in the genital area or rectal area.  Small red bumps that turn into blisters and then turn into sores.  Flu-like symptoms, including:  Fever.  Body aches.  Painful urination.  Vaginal discharge. DIAGNOSIS Genital herpes may be diagnosed by:  Physical exam.  Blood test.  Fluid culture test from an open sore. TREATMENT There is no cure for genital herpes. Oral antiviral medicines may be used to speed up healing and to help prevent the return of symptoms. These medicines can also help to reduce the spread of the virus to sexual partners. HOME CARE INSTRUCTIONS  Keep the affected areas dry and clean.  Take medicines only as directed by your health care provider.  Do not have sexual contact during active infections. Genital herpes is contagious.  Practice safe sex. Latex condoms and female condoms may help to prevent the spread of the herpes virus.  Avoid rubbing or touching the blisters and sores. If you do touch the blister or sores:  Wash your hands thoroughly.  Do not touch your eyes afterward.  If you become pregnant, tell your health care provider  if you have had genital herpes.  Keep all follow-up visits as directed by your health care provider. This is important. PREVENTION  Use condoms. Although anyone can contract genital herpes during sexual contact even with the use of a condom, a condom can provide some protection.  Avoid having multiple sexual partners.  Talk to your sexual partner about any symptoms and past history that either of you may have.  Get tested before you have sex. Ask your partner to do the same.  Recognize the symptoms of genital herpes. Do not have sexual contact if you notice these symptoms. SEEK MEDICAL CARE IF:  Your symptoms are not improving with medicine.  Your symptoms return.  You have new symptoms.  You have a fever.  You have abdominal pain.  You have redness, swelling, or pain in your eye. MAKE SURE YOU:  Understand these instructions.  Will watch your condition.  Will get help right away if you are not doing well or get worse.   This information is not intended to replace advice given to you by your health care provider. Make sure you discuss any questions you have with your health care provider.   Document Released: 09/30/2000 Document Revised: 10/24/2014 Document Reviewed: 02/18/2014 Elsevier Interactive Patient Education 2016 [employer]. Safe Sex Safe sex is about reducing the risk of giving or getting a sexually transmitted disease (STD). STDs are spread through sexual  contact involving the genitals, mouth, or rectum. Some STDs can be cured and others cannot. Safe sex can also prevent unintended pregnancies.  WHAT ARE SOME SAFE SEX PRACTICES?  Limit your sexual activity to only one partner who is having sex with only you.  Talk to your partner about his or her past partners, past STDs, and drug use.  Use a condom every time you have sexual intercourse. This includes vaginal, oral, and anal sexual activity. Both females and males should wear condoms during oral sex. Only  use latex or polyurethane condoms and water-based lubricants. Using petroleum-based lubricants or oils to lubricate a condom will weaken the condom and increase the chance that it will break. The condom should be in place from the beginning to the end of sexual activity. Wearing a condom reduces, but does not completely eliminate, your risk of getting or giving an STD. STDs can be spread by contact with infected body fluids and skin.  Get vaccinated for hepatitis B and HPV.  Avoid alcohol and recreational drugs, which can affect your judgment. You may forget to use a condom or participate in high-risk sex.  For females, avoid douching after sexual intercourse. Douching can spread an infection farther into the reproductive tract.  Check your body for signs of sores, blisters, rashes, or unusual discharge. See your health care provider if you notice any of these signs.  Avoid sexual contact if you have symptoms of an infection or are being treated for an STD. If you or your partner has herpes, avoid sexual contact when blisters are present. Use condoms at all other times.  If you are at risk of being infected with HIV, it is recommended that you take a prescription medicine daily to prevent HIV infection. This is called pre-exposure prophylaxis (PrEP). You are considered at risk if:  You are a man who has sex with other men (MSM).  You are a heterosexual man or woman who is sexually active with more than one partner.  You take drugs by injection.  You are sexually active with a partner who has HIV.  Talk with your health care provider about whether you are at high risk of being infected with HIV. If you choose to begin PrEP, you should first be tested for HIV. You should then be tested every 3 months for as long as you are taking PrEP.  See your health care provider for regular screenings, exams, and tests for other STDs. Before having sex with a new partner, each of you should be screened for  STDs and should talk about the results with each other. WHAT ARE THE BENEFITS OF SAFE SEX?   There is less chance of getting or giving an STD.  You can prevent unwanted or unintended pregnancies.  By discussing safe sex concerns with your partner, you may increase feelings of intimacy, comfort, trust, and honesty between the two of you.   This information is not intended to replace advice given to you by your health care provider. Make sure you discuss any questions you have with your health care provider.   Document Released: 11/10/2004 Document Revised: 10/24/2014 Document Reviewed: 03/26/2012 Elsevier Interactive Patient Education [employer]
